# Patient Record
Sex: Female | Born: 2000 | Race: Asian | Hispanic: No | Marital: Single | Smoking: Never smoker
Health system: Southern US, Community
[De-identification: ages and names within clinical notes are randomized; demographics above are authoritative.]

## PROBLEM LIST (undated history)

## (undated) DIAGNOSIS — J45909 Unspecified asthma, uncomplicated: Secondary | ICD-10-CM

---
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
## 2024-08-08 ENCOUNTER — Ambulatory Visit

## 2024-08-08 VITALS — BP 94/70 | HR 75 | Temp 98.8°F | Ht 59.0 in | Wt 110.0 lb

## 2024-08-08 DIAGNOSIS — J029 Acute pharyngitis, unspecified: Secondary | ICD-10-CM | POA: Diagnosis not present

## 2024-08-08 DIAGNOSIS — R051 Acute cough: Secondary | ICD-10-CM

## 2024-08-08 LAB — POC COVID19 BINAXNOW: SARS Coronavirus 2 Ag: NEGATIVE

## 2024-08-08 LAB — POCT INFLUENZA A/B
Influenza A, POC: NEGATIVE
Influenza B, POC: NEGATIVE

## 2024-08-08 LAB — POCT RAPID STREP A (OFFICE): Rapid Strep A Screen: NEGATIVE

## 2024-08-08 MED ORDER — IBUPROFEN 800 MG PO TABS: 800.0000 mg | ORAL_TABLET | Freq: Three times a day (TID) | ORAL | 0 refills | Status: AC | PRN

## 2024-08-08 NOTE — Patient Instructions (Addendum)
 Thank you for visiting [facility] today! Here's what we talked about: - START Advil  for throat pain and headache, take every 8 hours as needed - Your symptoms will improve on their own in about 5-7 days

## 2024-08-08 NOTE — Progress Notes (Signed)
 "  Subjective:   This visit was conducted in person. The patient gave informed consent to the use of [employer]bridge AI technology to record the contents of the encounter as documented below.   Patient ID: Carolyn Phelps, female    DOB: Dec 04, 2000, 23 y.o.   MRN: [id number]   Discussed the use of AI scribe software for clinical note transcription with the patient, who gave verbal consent to proceed.  History of Present Illness Carolyn Phelps is a 23 year old female who presents with headache, sore throat, and nasal congestion.  She has been experiencing headache, sore throat, runny nose, and nasal congestion since the previous weekend, with symptoms worsening yesterday. The headache is described as a pounding sensation located at the front of her head. She has tried Thermaflu at home, but its effectiveness is uncertain.  No fever at home, but she mentions feeling hot earlier. No chills, but she reports neck pain that started over the weekend, which she is unsure if it is related to her headache. No difficulty sleeping despite her symptoms.  Her sore throat is the most bothersome symptom, described as 'really dry' with mucus. No current cough, chest pain, or trouble breathing. She has experienced a little nausea over the last few days and reports a decreased appetite, although she can still smell and taste.  No one at home is currently sick, but her father had similar symptoms recently. She has been around people at church who were coughing and sick.  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 She has not taken any other medications besides Thermaflu for her current symptoms.  No ear pain, chest pain, trouble breathing, or cough. A little nausea and decreased appetite. No chills and fever at home but felt hot earlier.   Review of Systems  All other systems reviewed and are negative.       Allergies[1]  Medications Ordered Prior to  Encounter[2]  BP 94/70 (BP Location: Left Arm, Patient Position: Sitting, Cuff Size: Normal)   Pulse 75   Temp 98.8 F (37.1 C) (Oral)   Ht 4' 11 (1.499 m)   Wt 110 lb (49.9 kg)   LMP 08/02/2024 (Approximate)   SpO2 98%   BMI 22.22 kg/m   Objective:      Physical Exam GENERAL: Alert, cooperative, well developed, no acute distress. HEAD: Normocephalic atraumatic. EYES: Extraocular movements intact bilaterally, pupils round, equal and reactive to light bilaterally, conjunctivae normal bilaterally. EARS: Tympanic membrane, ear canal and external ear normal bilaterally. NOSE: No frontal or maxillary sinus tenderness bilaterally, no congestion or rhinorrhea, mucous membranes are moist. THROAT: Mild posterior pharyngeal erythema, mild tonsillar enlargement, no oropharyngeal exudate. CARDIOVASCULAR: Normal heart rate and rhythm, S1 and S2 normal without murmurs. CHEST: Clear to auscultation bilaterally, no wheezes, rhonchi, or crackles. NECK: Mild cervical lymphadenopathy.         Assessment & Plan:   Assessment & Plan Acute upper respiratory infection Likely viral etiology, possibly RSV or another virus. Neg flu, covid and strep. Symptoms expected to improve over 7-10 days. - Prescribed high-dose ibuprofen , one pill every eight hours as needed for sore throat and headache. - Advised against taking ibuprofen  on an empty stomach. - Provided a 10-day supply of ibuprofen . - Instructed to sanitize hands frequently and wear a mask around high-risk individuals. - Advised to monitor nasal congestion and contact if it worsens for potential nasal spray prescription.   Return for worsening of symptoms or  failure to improve.   Chrislyn Seedorf K Dora Clauss, MD  08/08/2024     Contains text generated by Abridge.       [1] No Known Allergies [2]  Current Outpatient Medications on File Prior to Visit  Medication Sig Dispense Refill   diphenhydrAMINE  (BENADRYL ) 12.5 MG/5ML elixir Take 5 mLs (12.5  mg total) by mouth once. 118 mL 0   No current facility-administered medications on file prior to visit.   "

## 2024-08-14 ENCOUNTER — Ambulatory Visit: Payer: Self-pay
